# Patient Record
Sex: Male | Born: 1995 | Race: White | Hispanic: No | Marital: Single | Smoking: Never smoker
Health system: Southern US, Community
[De-identification: ages and names within clinical notes are randomized; demographics above are authoritative.]

---
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
## 2014-04-27 IMAGING — CR DG ANKLE COMPLETE 3+V*L*
3 series · 3 of 3 positions shown · non-contrast
Comparison: None.

CLINICAL DATA: Lateral ankle pain/swelling

LEFT ANKLE COMPLETE - 3+ VIEW

[view not recorded (1 of 3)]
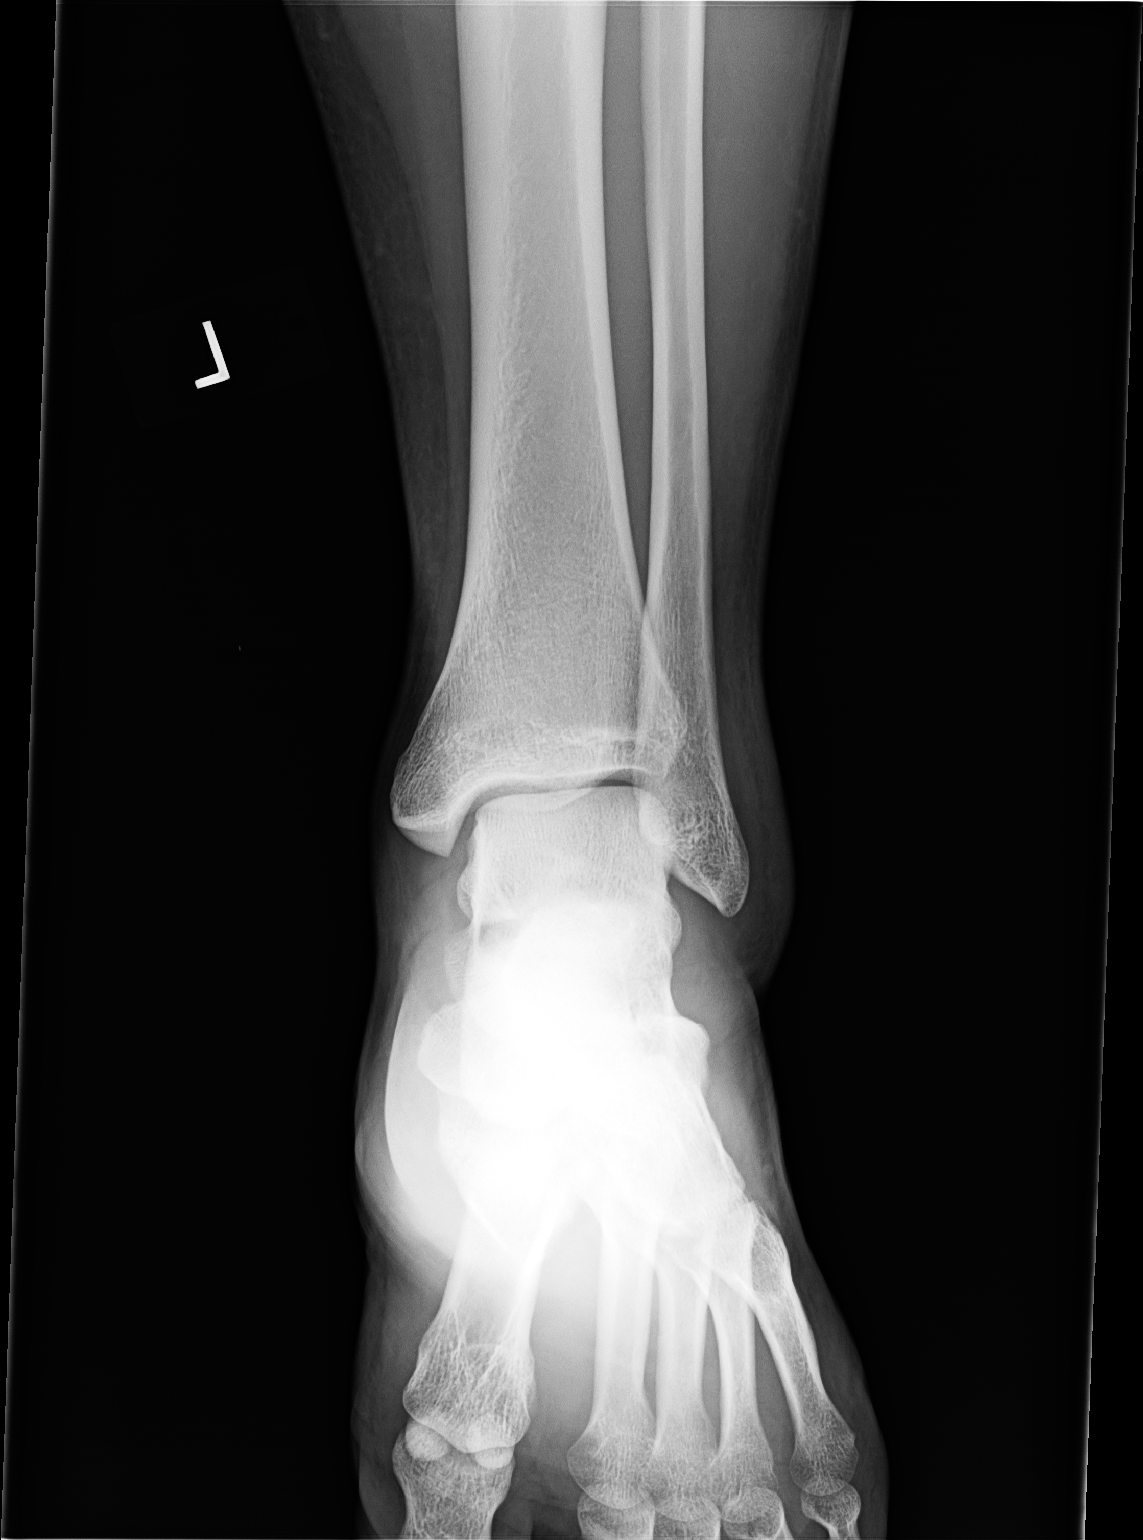

[view not recorded (2 of 3)]
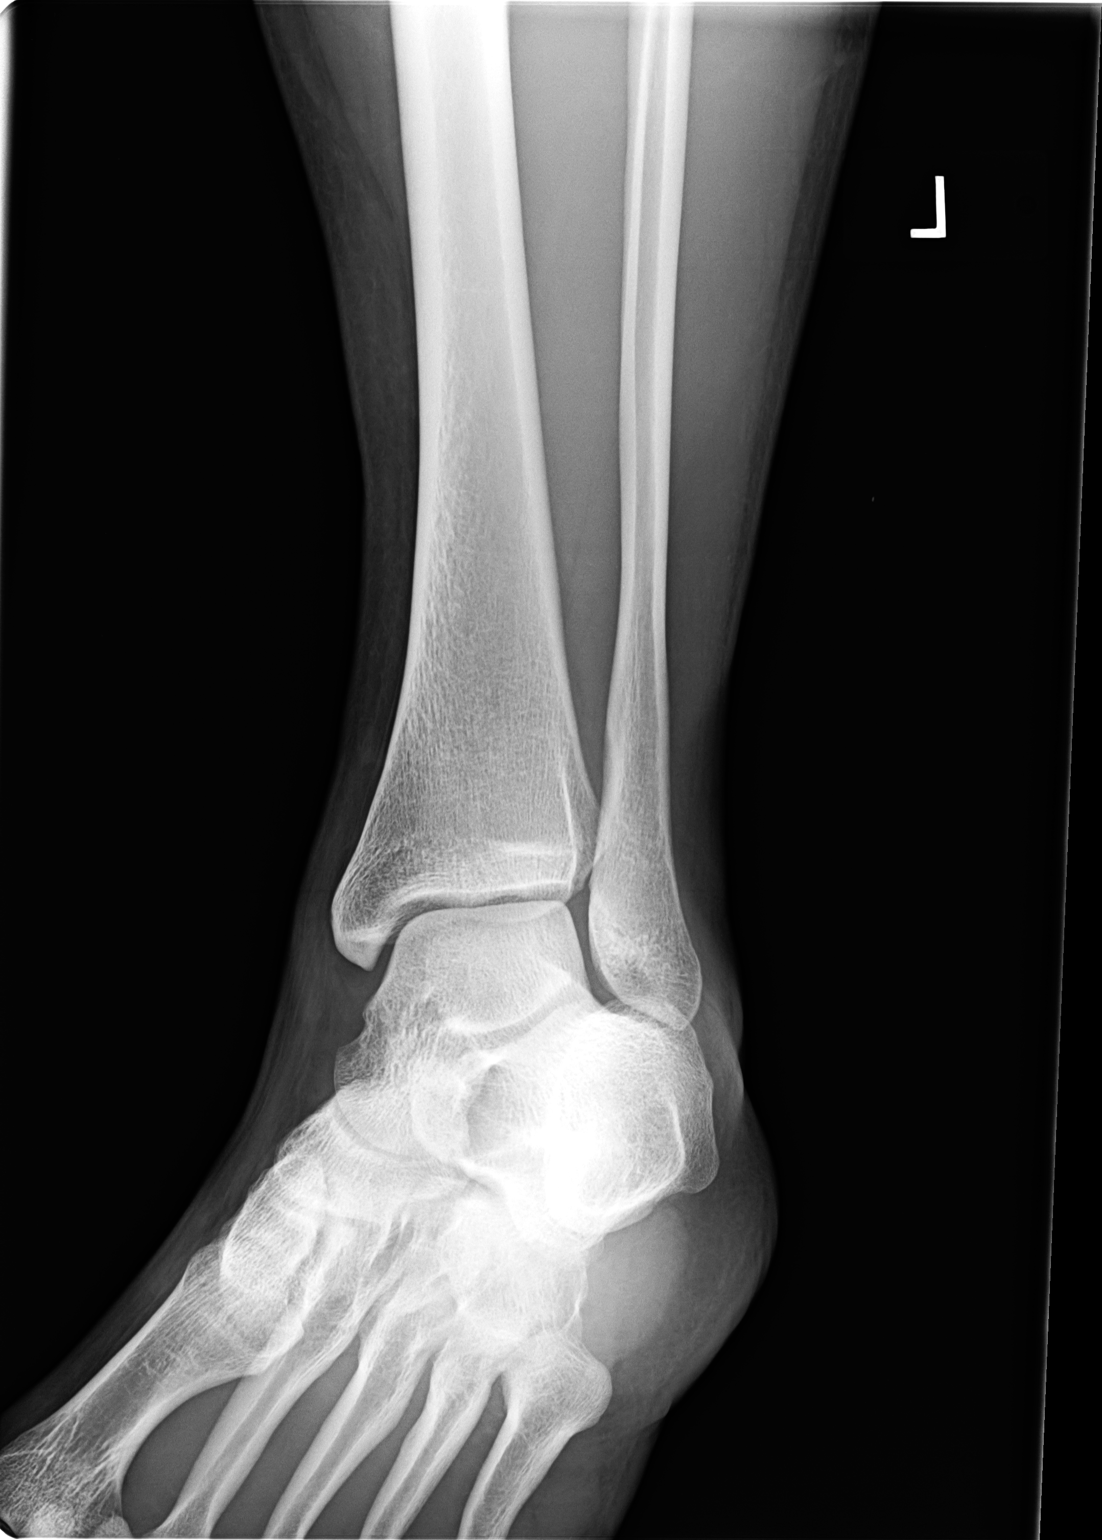

[view not recorded (3 of 3)]
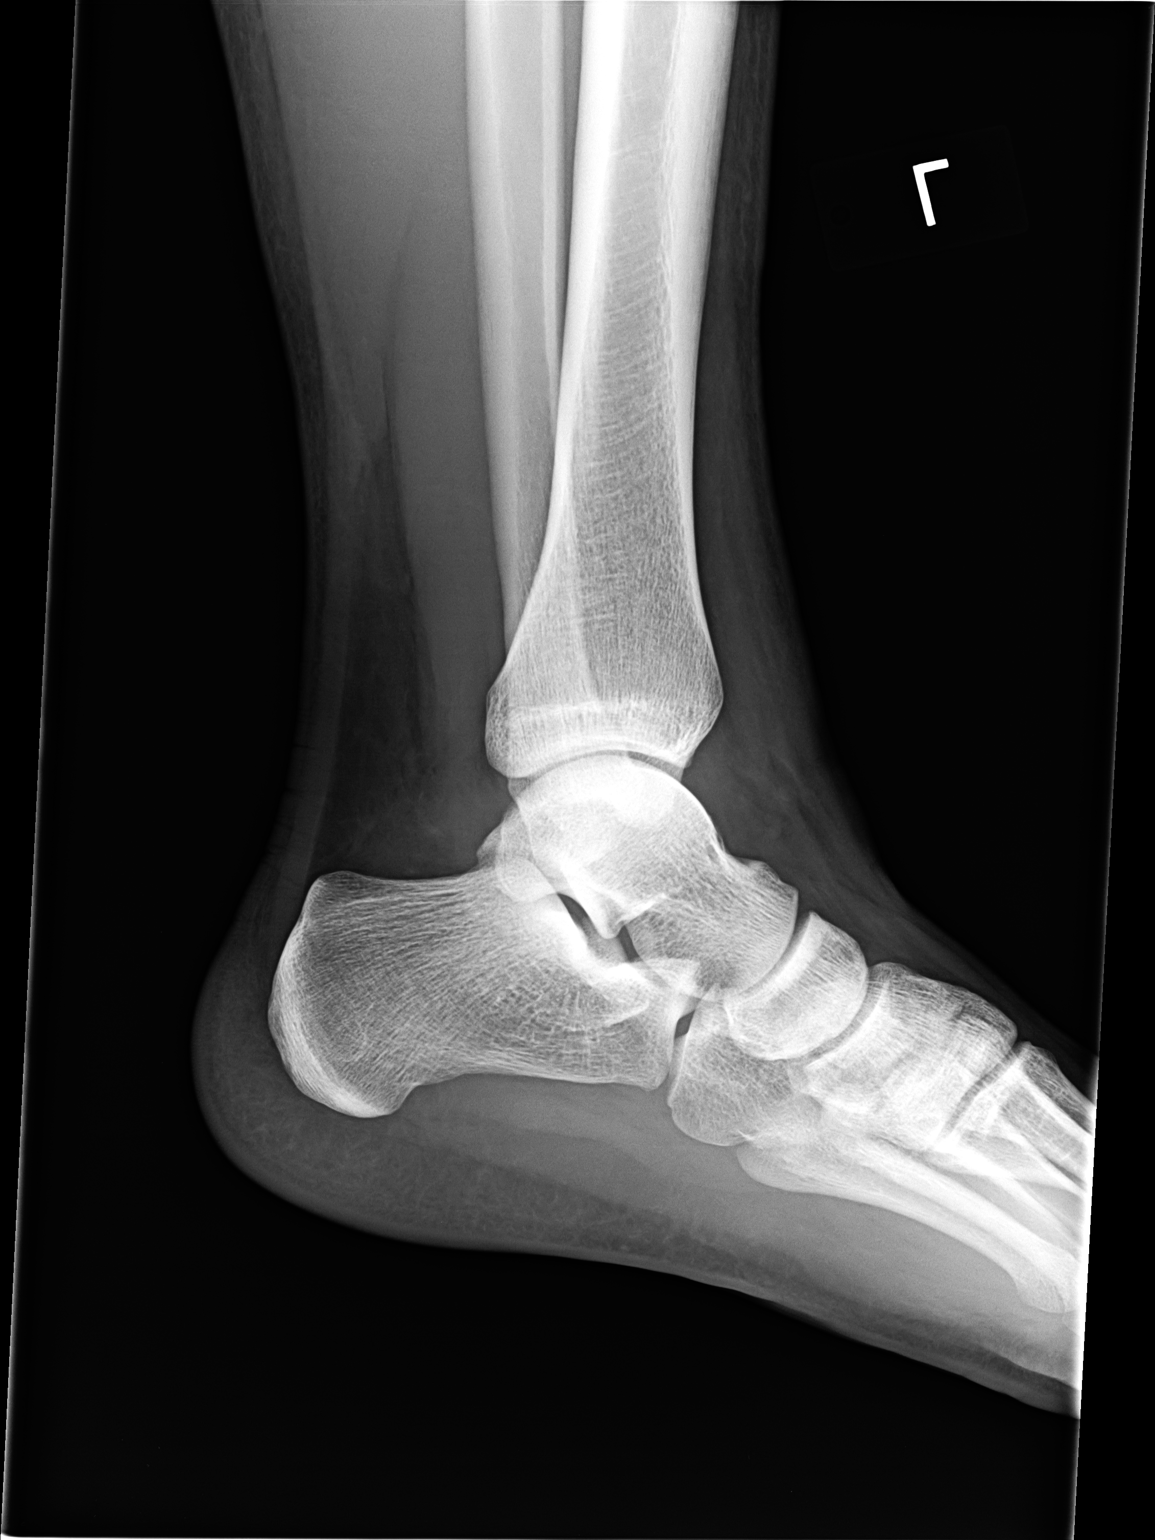

[3 of 3 positions shown; findings below may reference images not displayed]

FINDINGS: No fracture or dislocation is seen.

The ankle mortise is intact.

The base of the fifth metatarsal is unremarkable.

Mild lateral soft tissue swelling.
IMPRESSION: No fracture or dislocation is seen.

Mild lateral soft tissue swelling.

## 2017-11-06 ENCOUNTER — Other Ambulatory Visit: Payer: Self-pay

## 2017-11-06 ENCOUNTER — Ambulatory Visit: Payer: BLUE CROSS/BLUE SHIELD | Attending: Orthopedic Surgery | Admitting: Physical Therapy

## 2017-11-06 DIAGNOSIS — R252 Cramp and spasm: Secondary | ICD-10-CM | POA: Diagnosis present

## 2017-11-06 DIAGNOSIS — M545 Low back pain, unspecified: Secondary | ICD-10-CM

## 2017-11-06 NOTE — Therapy (Addendum)
[facility] [address] [place], [place], [address] Phone: [phone]   Fax:  [phone]  Physical Therapy Evaluation  Patient Details  Name: Paul Houston MRN: [id number] Date of Birth: 1995-02-24 Referring Provider (PT): Lunette Stands   Encounter Date: 11/06/2017  PT End of Session - 11/06/17 0909    Visit Number  1    Date for PT Re-Evaluation  01/01/18    PT Start Time  0909    PT Stop Time  1007    PT Time Calculation (min)  58 min       No past medical history on file.  No past surgical history on file.  There were no vitals filed for this visit.   Subjective Assessment - 11/06/17 0916    Subjective  Patient fell in the shower Sep 28th and landed on his back. He has been out of work since that time. He has to lift 30-100# at work regularly. Pain is from the middle of back down, He reports some weakness in his hips sometimes too.    Pertinent History  abdominal surgery when he was 98 weeks old    How long can you sit comfortably?  20 min    Diagnostic tests  xrays and mri - negative    Patient Stated Goals  to get rid of pain and back to work    Currently in Pain?  Yes    Pain Score  6     Pain Location  Back    Pain Orientation  Right;Left;Lower    Pain Descriptors / Indicators  Throbbing;Tightness;Tender    Pain Type  Acute pain    Pain Onset  More than a month ago    Pain Frequency  Constant    Aggravating Factors   sitting    Pain Relieving Factors  lying    Effect of Pain on Daily Activities  cannot work         [facility] PT Assessment - 11/06/17 0001      Assessment   Medical Diagnosis  low back strain    Referring Provider (PT)  Lunette Stands    Onset Date/Surgical Date  09/29/17    Hand Dominance  Right    Next MD Visit  11/27/17      Precautions   Precautions  None    Precaution Comments  no lifting; no work      Balance Screen   Has the patient fallen in the past 6 months  Yes    How many times?  1     Has the patient had a decrease in activity level because of a fear of falling?   No    Is the patient reluctant to leave their home because of a fear of falling?   No      Home Environment   Living Environment  Private residence    Living Arrangements  Other relatives    Additional Comments  going down stairs hurts; 3 stairs into house; doesn't have to go up full flight but they are in house      Prior Function   Level of Independence  Independent    Vocation  Full time employment    Vocation Requirements  lifting 30-100#    Leisure  sports      Posture/Postural Control   Posture/Postural Control  Postural limitations    Postural Limitations  Forward head;Rounded Shoulders;Decreased lumbar lordosis    Posture Comments  increased tone in R paraspinals  ROM / Strength   AROM / PROM / Strength  AROM;Strength      Strength   Strength Assessment Site  Hip;Knee;Ankle    Right/Left Hip  Right;Left    Right Hip Flexion  2+/5    Right Hip Extension  4/5    Right Hip ABduction  5/5    Left Hip Flexion  2+/5    Left Hip Extension  5/5    Left Hip ABduction  4-/5    Right/Left Knee  Right;Left    Right Knee Flexion  5/5    Right Knee Extension  4-/5    Left Knee Flexion  4/5    Left Knee Extension  4-/5    Right/Left Ankle  Right;Left    Right Ankle Dorsiflexion  4+/5    Left Ankle Dorsiflexion  4+/5      Flexibility   Soft Tissue Assessment /Muscle Length  yes    Hamstrings  bil tightness     Quadriceps  WNL    Piriformis  mild bil tightness      Palpation   Spinal mobility  unable to assess due to pain/spasm    Palpation comment  marked tenderness to bil Paraspinals and gluteals      Special Tests   Other special tests  negative lumbar special tests; patellar tendon reflex 3+ bil                Objective measurements completed on examination: See above findings.   Treatment: Estim 80-150 Hz x 20 min to lumbar/gluteals with heat to  tolerance             PT Short Term Goals - 11/06/17 1613      PT SHORT TERM GOAL #1   Title  Patient Ind with HEP     Time  4    Period  Weeks    Status  New    Target Date  12/04/17        PT Long Term Goals - 11/06/17 1613      PT LONG TERM GOAL #1   Title  Patient to report no pain with ADLS.    Time  8    Period  Weeks    Status  New    Target Date  01/01/18      PT LONG TERM GOAL #2   Title  Patient able to demonstrate correct body mechanics with lifting and carrying to prevent further injury    Time  8    Period  Weeks    Status  New      PT LONG TERM GOAL #3   Title  Patient demonstrate 5/5 BLE strength to allow for correct body mechanics    Time  8    Period  Weeks    Status  New      PT LONG TERM GOAL #4   Title  Patient able to sit without pain using correct posture    Time  8    Period  Weeks    Status  New             Plan - 11/06/17 1610    Clinical Impression Statement  Patient presents for low complexity evaluation for low back strain. He fell in the shower on 09/29/17 and landed right on his back. 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 He has limited ROM and strength deficits in BLE as well and is  limited in sitting.     History and Personal Factors relevant to plan of care:  unrmarkable    Clinical Presentation  Evolving    Clinical Decision Making  Low    Rehab Potential  Excellent    PT Frequency  2x / week    PT Duration  8 weeks    PT Treatment/Interventions  ADLs/Self Care Home Management;Cryotherapy;Electrical Stimulation;Moist Heat;Ultrasound;Therapeutic exercise;Neuromuscular re-education;Patient/family education;Manual techniques;Dry needling;Taping    PT Next Visit Plan  address pain and spasm then progress to lumbar stabilization    PT Home Exercise Plan  lumbar mobility in supine, LTR, SKTC    Consulted and Agree with Plan of Care  Patient       Patient will benefit  from skilled therapeutic intervention in order to improve the following deficits and impairments:  Abnormal gait, Pain, Increased muscle spasms, Postural dysfunction, Decreased activity tolerance, Decreased range of motion, Decreased strength, Impaired flexibility  Visit Diagnosis: Acute bilateral low back pain, unspecified whether sciatica present - Plan: PT plan of care cert/re-cert  Cramp and spasm - Plan: PT plan of care cert/re-cert     Problem List There are no active problems to display for this patient.   Paul Houston PT 11/06/2017, 4:20 PM  [facility] [address] [place], [place], [address] Phone: [phone]   Fax:  [phone]  Name: Paul Houston MRN: [id number] Date of Birth: May 01, 1995

## 2017-11-06 NOTE — Patient Instructions (Signed)
Abdominal bracing   Flatten back by tightening stomach muscles and buttocks. Release.  Repeat _10___ times per set. Do _1-3___ sets per session. Do __2__ sessions per day.  http://orth.exer.us/134   Copyright  VHI. All rights reserved. Knee to Chest (Flexion)   Pull knee toward chest. Feel stretch in lower back or buttock area. You may need to put the other leg straight to feel a better stretch. Breathing deeply, Hold __30__ seconds. Repeat with other knee. Repeat _3___ times each leg. Do _2___ sessions per day.   Lower Trunk Rotation Stretch   Keeping back flat and feet together, rotate knees to left side. Hold __10__ seconds. Repeat __5__ times per set. Do ____ sets per session. Do _2_ sessions per day.   Trigger Point Dry Needling  . What is Trigger Point Dry Needling (DN)? o DN is a physical therapy technique used to treat muscle pain and dysfunction. Specifically, DN helps deactivate muscle trigger points (muscle knots).  o A thin filiform needle is used to penetrate the skin and stimulate the underlying trigger point. The goal is for a local twitch response (LTR) to occur and for the trigger point to relax. No medication of any kind is injected during the procedure.   . What Does Trigger Point Dry Needling Feel Like?  o The procedure feels different for each individual patient. Some patients report that they do not actually feel the needle enter the skin and overall the process is not painful. Very mild bleeding may occur. However, many patients feel a deep cramping in the muscle in which the needle was inserted. This is the local twitch response.   Marland Kitchen How Will I feel after the treatment? o Soreness is normal, and the onset of soreness may not occur for a few hours. Typically this soreness does not last longer than two days.  o Bruising is uncommon, however; ice can be used to decrease any possible bruising.  o In rare cases feeling tired or nauseous after the treatment is  normal. In addition, your symptoms may get worse before they get better, this period will typically not last longer than 24 hours.   . What Can I do After My Treatment? o Increase your hydration by drinking more water for the next 24 hours. o You may place ice or heat on the areas treated that have become sore, however, do not use heat on inflamed or bruised areas. Heat often brings more relief post needling. o You can continue your regular activities, but vigorous activity is not recommended initially after the treatment for 24 hours. o DN is best combined with other physical therapy such as strengthening, stretching, and other therapies.    Precautions:  In some cases, dry needling is done over the lung field. While rare, there is a risk of pneumothorax (punctured lung). Because of this, if you ever experience shortness of breath on exertion, difficulty taking a deep breath, chest pain or a dry cough following dry needling, you should report to an emergency room and tell them that you have been dry needled over the thorax.   Solon Palm, PT 11/06/17 9:48 AM [facility] [address] [place], [place], [address] Phone: [phone]   Fax:  [phone]

## 2017-11-08 ENCOUNTER — Ambulatory Visit: Payer: BLUE CROSS/BLUE SHIELD | Admitting: Physical Therapy

## 2017-11-08 DIAGNOSIS — R252 Cramp and spasm: Secondary | ICD-10-CM

## 2017-11-08 DIAGNOSIS — M545 Low back pain, unspecified: Secondary | ICD-10-CM

## 2017-11-08 NOTE — Therapy (Signed)
[facility] [address] [place], [place], [address] Phone: [phone]   Fax:  [phone]  Physical Therapy Treatment  Patient Details  Name: Laron Boorman MRN: [id number] Date of Birth: March 10, 1995 Referring Provider (PT): Lunette Stands   Encounter Date: 11/08/2017  PT End of Session - 11/08/17 1118    Visit Number  2    Date for PT Re-Evaluation  01/01/18    PT Start Time  1118    PT Stop Time  1210    PT Time Calculation (min)  52 min    Activity Tolerance  Patient tolerated treatment well    Behavior During Therapy  [facility] for tasks assessed/performed       No past medical history on file.  No past surgical history on file.  There were no vitals filed for this visit.  Subjective Assessment - 11/08/17 1118    Subjective  Patient still hurting at 6/10. compliant with HEP    How long can you sit comfortably?  20 min    Diagnostic tests  xrays and mri - negative    Currently in Pain?  Yes    Pain Score  6     Pain Location  Back    Pain Orientation  Right;Left;Lower    Pain Descriptors / Indicators  Throbbing;Tightness;Tender    Pain Type  Acute pain                       [facility] Adult PT Treatment/Exercise - 11/08/17 0001      Exercises   Exercises  Knee/Hip      Knee/Hip Exercises: Stretches   Piriformis Stretch  Both;2 reps;60 seconds    Other Knee/Hip Stretches  SKTC 2 x30 sec    Other Knee/Hip Stretches  also knee to opp shoulder      Modalities   Modalities  Electrical Stimulation;Moist Heat      Moist Heat Therapy   Number Minutes Moist Heat  15 Minutes    Moist Heat Location  Lumbar Spine;Hip      Electrical Stimulation   Electrical Stimulation Location  lumbar gluts IFC 80-150 Hz x 15 min    Electrical Stimulation Goals  Tone;Pain      Manual Therapy   Manual Therapy  Soft tissue mobilization    Soft tissue mobilization  to bil lumbar paraspinals, QLs and gluteals/piriformis              PT Education - 11/08/17 1200    Education Details  HEP; use of ball for MFR    Person(s) Educated  Patient    Methods  Explanation;Demonstration;Handout    Comprehension  Verbalized understanding;Returned demonstration       PT Short Term Goals - 11/06/17 1613      PT SHORT TERM GOAL #1   Title  Patient Ind with HEP     Time  4    Period  Weeks    Status  New    Target Date  12/04/17        PT Long Term Goals - 11/06/17 1613      PT LONG TERM GOAL #1   Title  Patient to report no pain with ADLS.    Time  8    Period  Weeks    Status  New    Target Date  01/01/18      PT LONG TERM GOAL #2   Title  Patient able to demonstrate correct body mechanics with  lifting and carrying to prevent further injury    Time  8    Period  Weeks    Status  New      PT LONG TERM GOAL #3   Title  Patient demonstrate 5/5 BLE strength to allow for correct body mechanics    Time  8    Period  Weeks    Status  New      PT LONG TERM GOAL #4   Title  Patient able to sit without pain using correct posture    Time  8    Period  Weeks    Status  New            Plan - 11/08/17 1200    Clinical Impression Statement  Pt tolerated STW fairly well with biofreeze. He did well with stretches and HEP was reviewed and modified. Normal response to estim/heat.    PT Treatment/Interventions  ADLs/Self Care Home Management;Cryotherapy;Electrical Stimulation;Moist Heat;Ultrasound;Therapeutic exercise;Neuromuscular re-education;Patient/family education;Manual techniques;Dry needling;Taping    PT Next Visit Plan  DN to bil gluteals; address pain and spasm then progress to lumbar stabilization    PT Home Exercise Plan  lumbar mobility in supine, LTR, SKTC, piriformis     Consulted and Agree with Plan of Care  Patient       Patient will benefit from skilled therapeutic intervention in order to improve the following deficits and impairments:  Abnormal gait, Pain, Increased muscle  spasms, Postural dysfunction, Decreased activity tolerance, Decreased range of motion, Decreased strength, Impaired flexibility  Visit Diagnosis: Acute bilateral low back pain, unspecified whether sciatica present  Cramp and spasm     Problem List There are no active problems to display for this patient.   Izack Hoogland PT 11/08/2017, 12:04 PM  [facility] [address] [place], [place], [address] Phone: [phone]   Fax:  [phone]  Name: Alam Guterrez MRN: [id number] Date of Birth: 04/15/1995

## 2017-11-08 NOTE — Patient Instructions (Signed)
Knee-to-Chest Stretch: Unilateral    With hand behind right knee, pull knee in to chest until a comfortable stretch is felt in lower back and buttocks. Keep back relaxed. Hold _30___ seconds. Repeat _3___ times per set. Do ____ sets per session. Do __2__ sessions per day.   Piriformis (Supine)  Cross legs, right on top. Gently pull other knee toward chest until stretch is felt in buttock/hip of top leg. Hold __30-60__ seconds. Repeat __3__ times per set. Do ____ sets per session. Do _2___ sessions per day.  Hip Stretch  Put right ankle over left knee. Let right knee fall downward, but keep ankle in place. Feel the stretch in hip. May push down gently with hand to feel stretch. Hold ____ seconds while counting out loud. Repeat with other leg. Repeat ____ times. Do ____ sessions per day.   Stretching: Piriformis (Supine)  Pull right knee toward opposite shoulder. Hold ____ seconds. Relax. Repeat ____ times per set. Do ____ sets per session. Do ____ sessions per day.   Paul Houston, PT 11/08/17 11:55 AM; [facility] [address] [place], [place], [address] Phone: [phone]   Fax:  [phone]

## 2017-11-12 ENCOUNTER — Encounter: Payer: BLUE CROSS/BLUE SHIELD | Admitting: Physical Therapy

## 2017-11-15 ENCOUNTER — Ambulatory Visit: Payer: BLUE CROSS/BLUE SHIELD | Admitting: Physical Therapy

## 2017-11-15 ENCOUNTER — Encounter: Payer: Self-pay | Admitting: Physical Therapy

## 2017-11-15 DIAGNOSIS — M545 Low back pain, unspecified: Secondary | ICD-10-CM

## 2017-11-15 DIAGNOSIS — R252 Cramp and spasm: Secondary | ICD-10-CM

## 2017-11-15 NOTE — Therapy (Signed)
[facility] [address] [place], [place], [address] Phone: [phone]   Fax:  [phone]  Physical Therapy Treatment  Patient Details  Name: Paul SavinMason Houston MRN: [id number] Date of Birth: 02/22/1995 Referring Provider (PT): Lunette StandsAnna Voytek   Encounter Date: 11/15/2017  PT End of Session - 11/15/17 1100    Visit Number  3    Number of Visits  16    Date for PT Re-Evaluation  01/01/18    PT Start Time  0902    PT Stop Time  0958    PT Time Calculation (min)  56 min    Activity Tolerance  Patient tolerated treatment well    Behavior During Therapy  [facility]/performed       History reviewed. No pertinent past medical history.  History reviewed. No pertinent surgical history.  There were no vitals filed for this visit.  Subjective Assessment - 11/15/17 1106    Subjective  pain is a 7 today.    Pain Onset  More than a month ago                       [facility] - 11/15/17 0001      Modalities   Modalities  Electrical Stimulation;Ultrasound      Moist Heat Therapy   Number Minutes Moist Heat  20 Minutes    Moist Heat Location  Lumbar Spine      Electrical Stimulation   Electrical Stimulation Location  Bilateral SIJ's and lower lumbar region.    Electrical Stimulation Goals  Tone;Pain      Ultrasound   Ultrasound Location  Bilateral lower lumbar region.    Ultrasound Parameters  1.50 W/CM2 x 12 minutes.    Ultrasound Goals  Pain      Manual Therapy   Manual Therapy  Soft tissue mobilization    Soft tissue mobilization  Prone over 2 pillows for comfort:  STW/M to bilateral SIJ's and lumbar musculature x 11 minutes to reduce tone.               PT Short Term Goals - 11/06/17 1613      PT SHORT TERM GOAL #1   Title  Patient Ind with HEP     Time  4    Period  Weeks    Status  New    Target Date  12/04/17        PT Long Term Goals - 11/06/17 1613      PT  LONG TERM GOAL #1   Title  Patient to report no pain with ADLS.    Time  8    Period  Weeks    Status  New    Target Date  01/01/18      PT LONG TERM GOAL #2   Title  Patient able to demonstrate correct body mechanics with lifting and carrying to prevent further injury    Time  8    Period  Weeks    Status  New      PT LONG TERM GOAL #3   Title  Patient demonstrate 5/5 BLE strength to allow for correct body mechanics    Time  8    Period  Weeks    Status  New      PT LONG TERM GOAL #4   Title  Patient able to sit without pain using correct posture    Time  8    Period  Weeks    Status  New            Plan - 11/15/17 1108    Clinical Impression Statement  Patient did okay wiht treatment today.  His lumbar musculature was remarkable for increased tone, right > left today.      PT Next Visit Plan  DN to bil gluteals; address pain and spasm then progress to lumbar stabilization    Consulted and Agree with Plan of Care  Patient       Patient will benefit from skilled therapeutic intervention in order to improve the following deficits and impairments:  Abnormal gait, Pain, Increased muscle spasms, Postural dysfunction, Decreased activity tolerance, Decreased range of motion, Decreased strength, Impaired flexibility  Visit Diagnosis: Acute bilateral low back pain, unspecified whether sciatica present  Cramp and spasm     Problem List There are no active problems to display for this patient.   APPLEGATE, Paul Houston 11/15/2017, 11:10 AM  [facility] [address] [place], [place], [address] Phone: [phone]   Fax:  [phone]  Name: Paul Houston MRN: [id number] Date of Birth: 06-Jul-1995

## 2017-11-19 ENCOUNTER — Encounter: Payer: Self-pay | Admitting: Physical Therapy

## 2017-11-19 ENCOUNTER — Ambulatory Visit: Payer: BLUE CROSS/BLUE SHIELD | Admitting: Physical Therapy

## 2017-11-19 DIAGNOSIS — M545 Low back pain, unspecified: Secondary | ICD-10-CM

## 2017-11-19 DIAGNOSIS — R252 Cramp and spasm: Secondary | ICD-10-CM

## 2017-11-19 NOTE — Therapy (Signed)
[facility] [address] [place], [place], [address] Phone: [phone]   Fax:  [phone]  Physical Therapy Treatment  Patient Details  Name: Paul Houston MRN: [id number] Date of Birth: 08/04/1995 Referring Provider (PT): Lunette StandsAnna Voytek   Encounter Date: 11/19/2017  PT End of Session - 11/19/17 1034    Visit Number  4    Number of Visits  16    Date for PT Re-Evaluation  01/01/18    PT Start Time  1034    PT Stop Time  1118    PT Time Calculation (min)  44 min    Activity Tolerance  Patient tolerated treatment well    Behavior During Therapy  [facility]/per[facility]       History reviewed. No pertinent past medical history.  History reviewed. No pertinent surgical history.  There were no vitals filed for this visit.  Subjective Assessment - 11/19/17 1033    Subjective  Reports that his pain is no better. Has pain with lumbar flexion and extension. Goes back to MD on 11/27/2017. Reports that he may be willing to try DN for the pain.    Pertinent History  abdominal surgery when he was 632 weeks old    How long can you sit comfortably?  20 min    Diagnostic tests  xrays and mri - negative    Patient Stated Goals  to get rid of pain and back to work    Currently in Pain?  Yes    Pain Score  7     Pain Location  Back    Pain Orientation  Right;Left;Lower    Pain Descriptors / Indicators  Tightness    Pain Type  Acute pain    Pain Onset  More than a month ago         [facility] - 11/19/17 0001      Assessment   Medical Diagnosis  low back strain    Referring Provider (PT)  Lunette StandsAnna Voytek    Onset Date/Surgical Date  09/29/17    Hand Dominance  Right    Next MD Visit  11/27/17      Precautions   Precautions  None    Precaution Comments  no lifting; no work                   [facility] Adult [facility] Treatment/Exercise - 11/19/17 0001      Modalities   Modalities  Electrical  Stimulation;Ultrasound;Moist Heat      Moist Heat Therapy   Number Minutes Moist Heat  15 Minutes    Moist Heat Location  Lumbar Spine      Electrical Stimulation   Electrical Stimulation Location  B low back, SI joint    Electrical Stimulation Action  IFC    Electrical Stimulation Parameters  80-150 hz x15 min    Electrical Stimulation Goals  Tone;Pain      Ultrasound   Ultrasound Location  B low back/ SI joints    Ultrasound Parameters  1.5 w/cm2, 100%, 1 mhz x10 min    Ultrasound Goals  Pain      Manual Therapy   Manual Therapy  Soft tissue mobilization    Soft tissue mobilization  STW to B lumbar paraspinals, QL, SI joints in prone over pillow to reduce pain and low back tightness               PT Short Term Goals - 11/19/17 1126  PT SHORT TERM GOAL #1   Title  Patient Ind with HEP     Time  4    Period  Weeks    Status  Achieved        PT Long Term Goals - 11/19/17 1126      PT LONG TERM GOAL #1   Title  Patient to report no pain with ADLS.    Time  8    Period  Weeks    Status  On-going      PT LONG TERM GOAL #2   Title  Patient able to demonstrate correct body mechanics with lifting and carrying to prevent further injury    Time  8    Period  Weeks    Status  On-going      PT LONG TERM GOAL #3   Title  Patient demonstrate 5/5 BLE strength to allow for correct body mechanics    Time  8    Period  Weeks    Status  On-going      PT LONG TERM GOAL #4   Title  Patient able to sit without pain using correct posture    Time  8    Period  Weeks    Status  On-going            Plan - 11/19/17 1127    Clinical Impression Statement  Patient presented in clinic today with reports of continued lumbar tightness and tenderness along B SI joints. Patient presented with minimal to moderate tightness of QL and reports of tenderness along B SI joints. Patient reports compliance with HEP stretches and is still limited with activity per pain. Patient  still hesitant regarding work due to the heavy lifting and very demanding job requirements. Normal modalities response noted following removal of the modalities.    Rehab Potential  Excellent    PT Frequency  2x / week    PT Duration  8 weeks    PT Treatment/Interventions  ADLs/Self Care Home Management;Cryotherapy;Electrical Stimulation;Moist Heat;Ultrasound;Therapeutic exercise;Neuromuscular re-education;Patient/family education;Manual techniques;Dry needling;Taping    PT Next Visit Plan  DN to bil gluteals; address pain and spasm then progress to lumbar stabilization    PT Home Exercise Plan  lumbar mobility in supine, LTR, SKTC, piriformis     Consulted and Agree with Plan of Care  Patient       Patient will benefit from skilled therapeutic intervention in order to improve the following deficits and impairments:  Abnormal gait, Pain, Increased muscle spasms, Postural dysfunction, Decreased activity tolerance, Decreased range of motion, Decreased strength, Impaired flexibility  Visit Diagnosis: Acute bilateral low back pain, unspecified whether sciatica present  Cramp and spasm     Problem List There are no active problems to display for this patient.   Marvell Fuller, PTA 11/19/2017, 11:34 AM  [facility] [address] [place], [place], [address] Phone: [phone]   Fax:  [phone]  Name: Paul Houston MRN: [id number] Date of Birth: 1995-03-28

## 2017-11-22 ENCOUNTER — Ambulatory Visit: Payer: BLUE CROSS/BLUE SHIELD | Admitting: Physical Therapy

## 2017-11-22 ENCOUNTER — Encounter: Payer: Self-pay | Admitting: Physical Therapy

## 2017-11-22 DIAGNOSIS — R252 Cramp and spasm: Secondary | ICD-10-CM

## 2017-11-22 DIAGNOSIS — M545 Low back pain, unspecified: Secondary | ICD-10-CM

## 2017-11-22 NOTE — Therapy (Signed)
[facility] [address] [place], [place], [address] Phone: [phone]   Fax:  [phone]  Physical Therapy Treatment  Patient Details  Name: Paul SavinMason Houston MRN: [id number] Date of Birth: 10/05/1995 Referring Provider (PT): Paul Houston   Encounter Date: 11/22/2017  PT End of Session - 11/22/17 1034    Visit Number  5    Number of Visits  16    Date for PT Re-Evaluation  01/01/18    PT Start Time  1034    PT Stop Time  1116    PT Time Calculation (min)  42 min    Activity Tolerance  Patient tolerated treatment well    Behavior During Therapy  [facility] for tasks assessed/performed       History reviewed. No pertinent past medical history.  History reviewed. No pertinent surgical history.  There were no vitals filed for this visit.  Subjective Assessment - 11/22/17 1033    Subjective  Reports that he bent over to the floor this morning and had an intense sharp pain in L low back. L low back worse than R low back today.    Pertinent History  abdominal surgery when he was 622 weeks old    How long can you sit comfortably?  20 min    Diagnostic tests  xrays and mri - negative    Patient Stated Goals  to get rid of pain and back to work    Currently in Pain?  Yes    Pain Score  7     Pain Location  Back    Pain Orientation  Right;Left;Lower    Pain Descriptors / Indicators  Sharp;Tightness    Pain Type  Acute pain    Pain Onset  More than a month ago    Pain Frequency  Constant         [facility] PT Assessment - 11/22/17 0001      Assessment   Medical Diagnosis  low back strain    Referring Provider (PT)  Paul Houston    Onset Date/Surgical Date  09/29/17    Hand Dominance  Right    Next MD Visit  11/27/17      Precautions   Precautions  None    Precaution Comments  no lifting; no work                   [facility] Adult PT Treatment/Exercise - 11/22/17 0001      Modalities   Modalities  Electrical  Stimulation;Ultrasound;Moist Heat      Moist Heat Therapy   Number Minutes Moist Heat  15 Minutes    Moist Heat Location  Lumbar Spine      Electrical Stimulation   Electrical Stimulation Location  B low back, SI joint    Electrical Stimulation Action  IFC    Electrical Stimulation Parameters  80-150 hz x15 min    Electrical Stimulation Goals  Tone;Pain      Ultrasound   Ultrasound Location  B lumbar paraspinals    Ultrasound Parameters  1.5 w/cm2, 100%, 1 mhz x10 min    Ultrasound Goals  Pain      Manual Therapy   Manual Therapy  Soft tissue mobilization    Soft tissue mobilization  STW to B lumbar paraspinals, QL, SI joint to reduce pain and tightness but especially L lumbar musculature                PT Short Term Goals - 11/19/17 1126  PT SHORT TERM GOAL #1   Title  Patient Ind with HEP     Time  4    Period  Weeks    Status  Achieved        PT Long Term Goals - 11/19/17 1126      PT LONG TERM GOAL #1   Title  Patient to report no pain with ADLS.    Time  8    Period  Weeks    Status  On-going      PT LONG TERM GOAL #2   Title  Patient able to demonstrate correct body mechanics with lifting and carrying to prevent further injury    Time  8    Period  Weeks    Status  On-going      PT LONG TERM GOAL #3   Title  Patient demonstrate 5/5 BLE strength to allow for correct body mechanics    Time  8    Period  Weeks    Status  On-going      PT LONG TERM GOAL #4   Title  Patient able to sit without pain using correct posture    Time  8    Period  Weeks    Status  On-going            Plan - 11/22/17 1112    Clinical Impression Statement  Patient continues to report pain with ADLs and resulting tightness. Patient especially symptomatic in L low back per patient report. Normal modalities response noted following removal of the modalities. Patient very tender and sensitive to palpation and manual therapy to superior L Ql and lumbar paraspinals.  Patient still hesitant regarding return to work due to heavy lifting required in job.    Rehab Potential  Excellent    PT Frequency  2x / week    PT Duration  8 weeks    PT Treatment/Interventions  ADLs/Self Care Home Management;Cryotherapy;Electrical Stimulation;Moist Heat;Ultrasound;Therapeutic exercise;Neuromuscular re-education;Patient/family education;Manual techniques;Dry needling;Taping    PT Next Visit Plan  DN to bil gluteals; address pain and spasm then progress to lumbar stabilization    PT Home Exercise Plan  lumbar mobility in supine, LTR, SKTC, piriformis     Consulted and Agree with Plan of Care  Patient       Patient will benefit from skilled therapeutic intervention in order to improve the following deficits and impairments:  Abnormal gait, Pain, Increased muscle spasms, Postural dysfunction, Decreased activity tolerance, Decreased range of motion, Decreased strength, Impaired flexibility  Visit Diagnosis: Acute bilateral low back pain, unspecified whether sciatica present  Cramp and spasm     Problem List There are no active problems to display for this patient.   Paul Houston, PTA 11/22/2017, 11:29 AM  [facility] [address] [place], [place], [address] Phone: [phone]   Fax:  [phone]  Name: Paul Houston MRN: [id number] Date of Birth: 1995-11-28

## 2017-11-26 ENCOUNTER — Encounter: Payer: Self-pay | Admitting: Physical Therapy

## 2017-11-26 ENCOUNTER — Ambulatory Visit: Payer: BLUE CROSS/BLUE SHIELD | Admitting: Physical Therapy

## 2017-11-26 DIAGNOSIS — M545 Low back pain, unspecified: Secondary | ICD-10-CM

## 2017-11-26 DIAGNOSIS — R252 Cramp and spasm: Secondary | ICD-10-CM

## 2017-11-26 NOTE — Therapy (Signed)
[facility] [address] [place], [place], [address] Phone: [phone]   Fax:  [phone]  Physical Therapy Treatment  Patient Details  Name: Paul Houston MRN: [id number] Date of Birth: 01/16/1995 Referring Provider (PT): Lunette StandsAnna Voytek   Encounter Date: 11/26/2017  PT End of Session - 11/26/17 1603    Visit Number  6    Number of Visits  16    Date for PT Re-Evaluation  01/01/18    PT Start Time  1518    PT Stop Time  1609    PT Time Calculation (min)  51 min    Activity Tolerance  Patient tolerated treatment well    Behavior During Therapy  [facility] for tasks assessed/performed       History reviewed. No pertinent past medical history.  History reviewed. No pertinent surgical history.  There were no vitals filed for this visit.  Subjective Assessment - 11/26/17 1603    Subjective  Patient reports feeling like therapy is helping. Pain currently is a 5-6/10    Pertinent History  abdominal surgery when he was 442 weeks old    How long can you sit comfortably?  20 min    Diagnostic tests  xrays and mri - negative    Patient Stated Goals  to get rid of pain and back to work    Currently in Pain?  Yes    Pain Score  6     Pain Location  Back    Pain Orientation  Right;Left;Lower    Pain Descriptors / Indicators  Tightness    Pain Type  Acute pain    Pain Onset  More than a month ago    Pain Frequency  Constant         [facility] PT Assessment - 11/26/17 0001      Assessment   Medical Diagnosis  low back strain    Referring Provider (PT)  Lunette StandsAnna Voytek    Onset Date/Surgical Date  09/29/17    Hand Dominance  Right    Next MD Visit  11/27/17      Precautions   Precautions  None    Precaution Comments  no lifting; no work                   [facility] Adult PT Treatment/Exercise - 11/26/17 0001      Moist Heat Therapy   Moist Heat Location  Lumbar Spine      Electrical Stimulation   Electrical Stimulation Location  B  low back, SI joint    Electrical Stimulation Action  IFC    Electrical Stimulation Parameters  80-150 hz x15 min    Electrical Stimulation Goals  Tone;Pain      Ultrasound   Ultrasound Location  B lumbar paraspinals    Ultrasound Parameters  1.5 w/cm2 100% 1 mHz    Ultrasound Goals  Pain      Manual Therapy   Manual Therapy  Soft tissue mobilization    Soft tissue mobilization  STW to B lumbar paraspinals, QL, SI joint to reduce pain and tightness but especially L lumbar musculature                PT Short Term Goals - 11/19/17 1126      PT SHORT TERM GOAL #1   Title  Patient Ind with HEP     Time  4    Period  Weeks    Status  Achieved  PT Long Term Goals - 11/19/17 1126      PT LONG TERM GOAL #1   Title  Patient to report no pain with ADLS.    Time  8    Period  Weeks    Status  On-going      PT LONG TERM GOAL #2   Title  Patient able to demonstrate correct body mechanics with lifting and carrying to prevent further injury    Time  8    Period  Weeks    Status  On-going      PT LONG TERM GOAL #3   Title  Patient demonstrate 5/5 BLE strength to allow for correct body mechanics    Time  8    Period  Weeks    Status  On-going      PT LONG TERM GOAL #4   Title  Patient able to sit without pain using correct posture    Time  8    Period  Weeks    Status  On-going            Plan - 11/26/17 1604    Clinical Impression Statement  Patient was able to tolerate treatment well with no reports of increased pain. Patient still very tender to palpation to QL and paraspinals bilaterally but noted with decreased tenderness as STW/M continued. Patient instructed to continue HEP provided and was educated addressing lumbar stabilization and strengthening in the next coming visits. Patient reported understanding. No adverse affects noted upon removal.     Clinical Presentation  Evolving    Clinical Decision Making  Low    Rehab Potential  Excellent    PT  Frequency  2x / week    PT Duration  8 weeks    PT Treatment/Interventions  ADLs/Self Care Home Management;Cryotherapy;Electrical Stimulation;Moist Heat;Ultrasound;Therapeutic exercise;Neuromuscular re-education;Patient/family education;Manual techniques;Dry needling;Taping    PT Next Visit Plan  DN to bil gluteals; address pain and spasm then progress to lumbar stabilization    Consulted and Agree with Plan of Care  Patient       Patient will benefit from skilled therapeutic intervention in order to improve the following deficits and impairments:  Abnormal gait, Pain, Increased muscle spasms, Postural dysfunction, Decreased activity tolerance, Decreased range of motion, Decreased strength, Impaired flexibility  Visit Diagnosis: Acute bilateral low back pain, unspecified whether sciatica present  Cramp and spasm     Problem List There are no active problems to display for this patient.  Guss Bunde, PT, DPT 11/26/2017, 4:18 PM  [facility] [address] [place], [place], [address] Phone: [phone]   Fax:  [phone]  Name: Paul Houston MRN: [id number] Date of Birth: 1995/11/16

## 2017-12-03 ENCOUNTER — Encounter: Payer: Self-pay | Admitting: Physical Therapy

## 2017-12-03 ENCOUNTER — Ambulatory Visit: Payer: BLUE CROSS/BLUE SHIELD | Attending: Orthopedic Surgery | Admitting: Physical Therapy

## 2017-12-03 DIAGNOSIS — M545 Low back pain, unspecified: Secondary | ICD-10-CM

## 2017-12-03 DIAGNOSIS — R252 Cramp and spasm: Secondary | ICD-10-CM | POA: Diagnosis present

## 2017-12-03 NOTE — Therapy (Addendum)
[facility] [place], [place], [address] Phone: [phone]   Fax:  [phone]  Physical Therapy Treatment  Patient Details  Name: Paul Houston MRN: [id number] Date of Birth: 09/02/1995 Referring Provider (PT): Almedia Balls   Encounter Date: 12/03/2017  PT End of Session - 12/03/17 0826    Visit Number  7    Number of Visits  16    Date for PT Re-Evaluation  01/01/18    PT Start Time  0821    PT Stop Time  0906    PT Time Calculation (min)  45 min    Activity Tolerance  Patient tolerated treatment well    Behavior During Therapy  [facility] for tasks assessed/performed       History reviewed. No pertinent past medical history.  History reviewed. No pertinent surgical history.  There were no vitals filed for this visit.  Subjective Assessment - 12/03/17 0825    Subjective  Reports riding in the car yesterday and has had increased pain.    Pertinent History  abdominal surgery when he was 3 weeks old    How long can you sit comfortably?  20 min    Diagnostic tests  xrays and mri - negative    Patient Stated Goals  to get rid of pain and back to work    Currently in Pain?  Yes    Pain Score  8     Pain Location  Back    Pain Orientation  Lower    Pain Descriptors / Indicators  Tightness;Sharp    Pain Type  Acute pain    Pain Onset  More than a month ago    Pain Frequency  Constant         [facility] PT Assessment - 12/03/17 0001      Assessment   Medical Diagnosis  low back strain    Referring Provider (PT)  Almedia Balls    Onset Date/Surgical Date  09/29/17    Hand Dominance  Right    Next MD Visit  12/2017      Precautions   Precautions  None    Precaution Comments  no lifting; no work                   [facility] Adult PT Treatment/Exercise - 12/03/17 0001      Modalities   Modalities  Electrical Stimulation;Ultrasound;Moist Heat      Moist Heat Therapy   Number Minutes Moist Heat  15 Minutes     Moist Heat Location  Lumbar Spine      Electrical Stimulation   Electrical Stimulation Location  B low back, SI joint    Electrical Stimulation Action  Pre-Mod    Electrical Stimulation Parameters  80-150 hz x15 min    Electrical Stimulation Goals  Tone;Pain      Ultrasound   Ultrasound Location  B lumbar paraspinals/ SI joints    Ultrasound Parameters  1.5 w/cm2, 100%, 1 mhz x10 min    Ultrasound Goals  Pain      Manual Therapy   Manual Therapy  Soft tissue mobilization    Soft tissue mobilization  STW to B lumbar paraspinals, QL, SI joint to reduce pain and tightness but especially L lumbar musculature                PT Short Term Goals - 11/19/17 1126      PT SHORT TERM GOAL #1   Title  Patient Ind with  HEP     Time  4    Period  Weeks    Status  Achieved        PT Long Term Goals - 11/19/17 1126      PT LONG TERM GOAL #1   Title  Patient to report no pain with ADLS.    Time  8    Period  Weeks    Status  On-going      PT LONG TERM GOAL #2   Title  Patient able to demonstrate correct body mechanics with lifting and carrying to prevent further injury    Time  8    Period  Weeks    Status  On-going      PT LONG TERM GOAL #3   Title  Patient demonstrate 5/5 BLE strength to allow for correct body mechanics    Time  8    Period  Weeks    Status  On-going      PT LONG TERM GOAL #4   Title  Patient able to sit without pain using correct posture    Time  8    Period  Weeks    Status  On-going            Plan - 12/03/17 0910    Clinical Impression Statement  Patient presented in clinic with reports of increased constant LBP from prolonged riding yesterday. Reports still limiting with limiting and prolonged standing as well. Minimal to moderate lumbar tightness palpable in B lumbar paraspinals and QL. No complaints of any increased pain or tendernes with manual therapy. Normal modalities response noted following removal of the modalities.    Rehab  Potential  Excellent    PT Frequency  2x / week    PT Duration  8 weeks    PT Treatment/Interventions  ADLs/Self Care Home Management;Cryotherapy;Electrical Stimulation;Moist Heat;Ultrasound;Therapeutic exercise;Neuromuscular re-education;Patient/family education;Manual techniques;Dry needling;Taping    PT Next Visit Plan  Progress per pain symptoms.    PT Home Exercise Plan  lumbar mobility in supine, LTR, SKTC, piriformis     Consulted and Agree with Plan of Care  Patient       Patient will benefit from skilled therapeutic intervention in order to improve the following deficits and impairments:  Abnormal gait, Pain, Increased muscle spasms, Postural dysfunction, Decreased activity tolerance, Decreased range of motion, Decreased strength, Impaired flexibility  Visit Diagnosis: Acute bilateral low back pain, unspecified whether sciatica present  Cramp and spasm     Problem List There are no active problems to display for this patient.   Standley Brooking, PTA 12/03/2017, 9:14 AM  [facility] [address] [place], [place], [address] Phone: [phone]   Fax:  [phone]  Name: Paul Houston MRN: [id number] Date of Birth: 1995/04/24  PHYSICAL THERAPY DISCHARGE SUMMARY  Visits from Start of Care: 7.  Current functional level related to goals / functional outcomes: See above.   Remaining deficits: See below.   Education / Equipment: HEP. Plan: Patient agrees to discharge.  Patient goals were not met. Patient is being discharged due to not returning since the last visit.  ?????         Mali Applegate MPT

## 2017-12-06 ENCOUNTER — Ambulatory Visit: Payer: BLUE CROSS/BLUE SHIELD | Admitting: Physical Therapy

## 2019-03-25 ENCOUNTER — Encounter: Payer: Self-pay | Admitting: General Practice

## 2019-03-25 ENCOUNTER — Ambulatory Visit: Payer: Self-pay | Admitting: Family Medicine
